# Patient Record
Sex: Male | Born: 1995 | Race: Asian | Hispanic: No | Marital: Single | Smoking: Never smoker
Health system: Southern US, Community
[De-identification: ages and names within clinical notes are randomized; demographics above are authoritative.]

## PROBLEM LIST (undated history)

## (undated) DIAGNOSIS — M21612 Bunion of left foot: Secondary | ICD-10-CM

## (undated) DIAGNOSIS — M79671 Pain in right foot: Secondary | ICD-10-CM

## (undated) DIAGNOSIS — M7740 Metatarsalgia, unspecified foot: Secondary | ICD-10-CM

## (undated) DIAGNOSIS — M21611 Bunion of right foot: Secondary | ICD-10-CM

## (undated) DIAGNOSIS — F819 Developmental disorder of scholastic skills, unspecified: Secondary | ICD-10-CM

## (undated) DIAGNOSIS — R269 Unspecified abnormalities of gait and mobility: Secondary | ICD-10-CM

## (undated) DIAGNOSIS — M79672 Pain in left foot: Secondary | ICD-10-CM

## (undated) HISTORY — DX: Developmental disorder of scholastic skills, unspecified: F81.9

## (undated) HISTORY — DX: Unspecified abnormalities of gait and mobility: R26.9

## (undated) HISTORY — DX: Bunion of right foot: M21.612

## (undated) HISTORY — DX: Bunion of right foot: M21.611

## (undated) HISTORY — DX: Pain in left foot: M79.672

## (undated) HISTORY — DX: Metatarsalgia, unspecified foot: M77.40

## (undated) HISTORY — DX: Pain in right foot: M79.671

---
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
## 2017-10-25 IMAGING — MR MR KNEE*R* W/O CM
6 series · 35 of 40 positions shown · non-contrast
Comparison: None.

CLINICAL DATA: Right knee pain.  Injured 1 week ago snowboarding.

EXAM:
MRI OF THE RIGHT KNEE WITHOUT CONTRAST
TECHNIQUE: Multiplanar, multisequence MR imaging of the knee was performed. No
intravenous contrast was administered.

[Series 7: T1 · coronal · right · 3.0mm · 0.33mm/px · 3 of 31 slices shown]
[im 1/31]
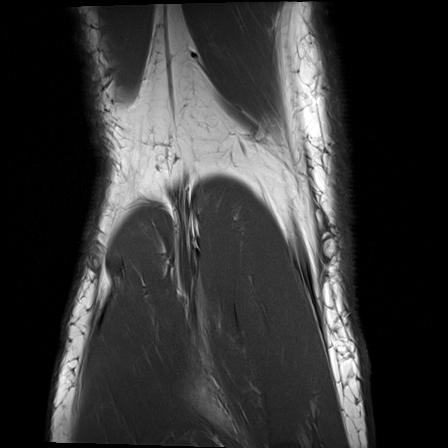
[im 5/31]
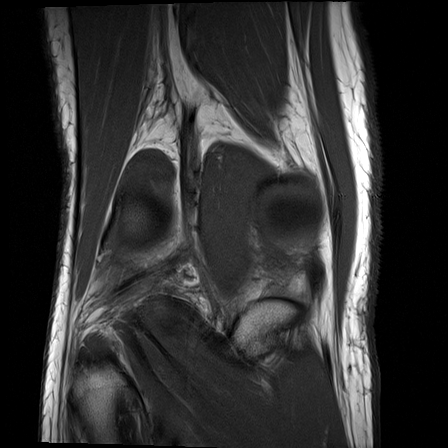
[im 9/31]
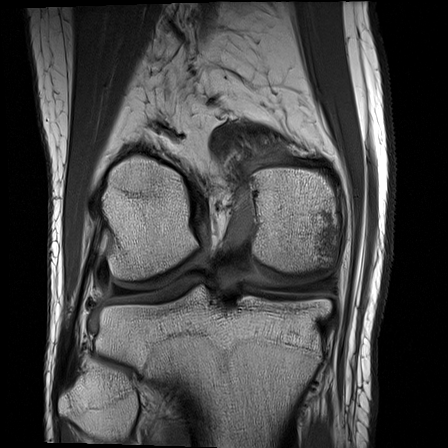

[Series 8: PD fat-sat · sagittal · right · 3.0mm · 0.47mm/px · 7 of 33 slices shown (1 of 3)]
[im 1/33]
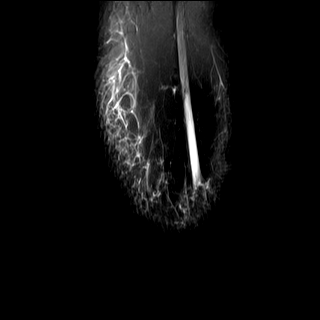
[im 6/33]
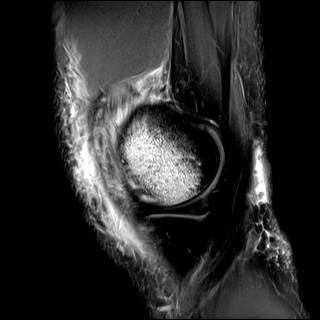
[im 11/33]
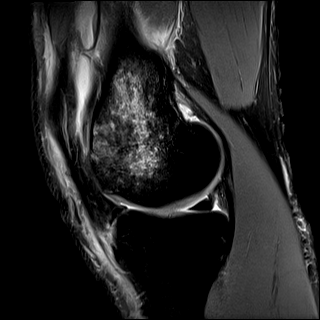
[im 17/33]
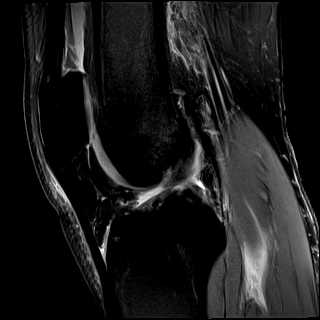
[im 22/33]
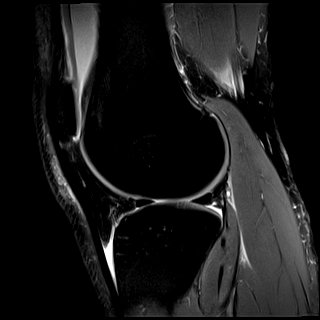
[im 27/33]
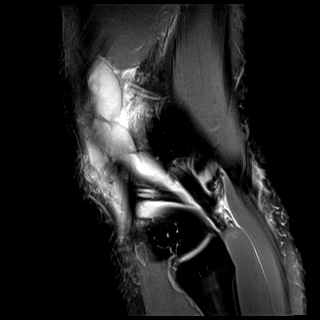
[im 33/33]
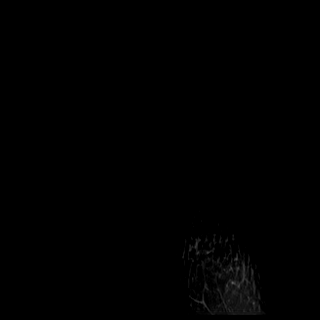

[Series 9: PD fat-sat · coronal · right · 3.0mm · 0.33mm/px · 7 of 31 slices shown (2 of 3)]
[im 1/31]
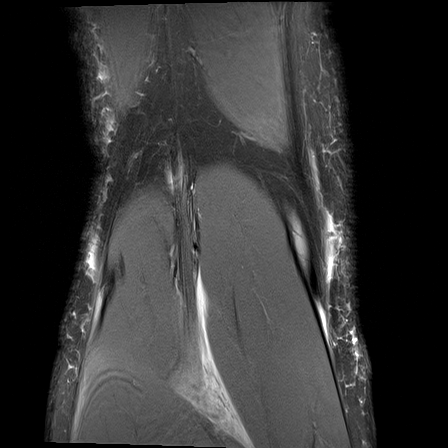
[im 6/31]
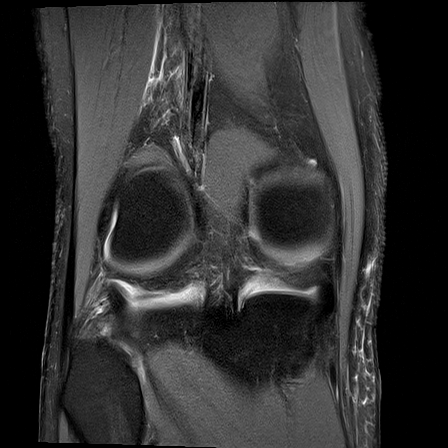
[im 11/31]
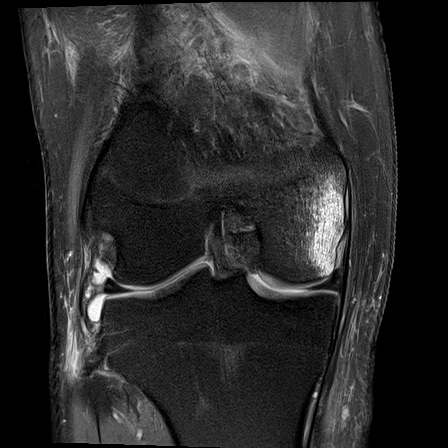
[im 16/31]
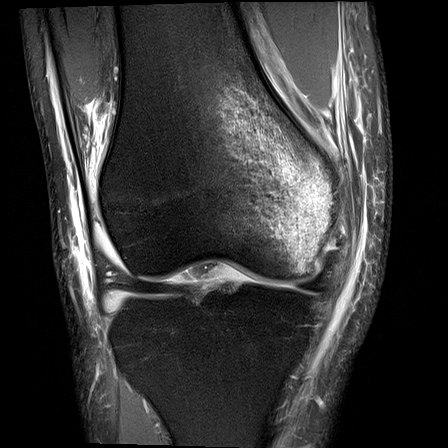
[im 21/31]
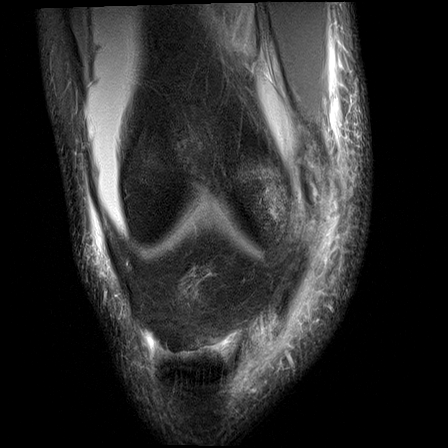
[im 26/31]
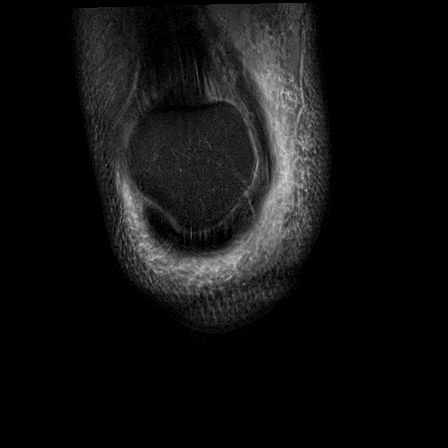
[im 31/31]
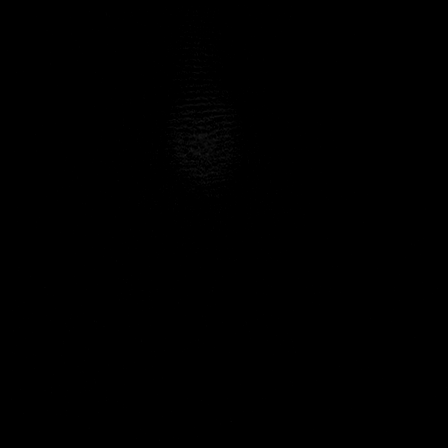

[Series 10: T2 fat-sat · coronal · right · 3.0mm · 0.39mm/px · 7 of 31 slices shown]
[im 1/31]
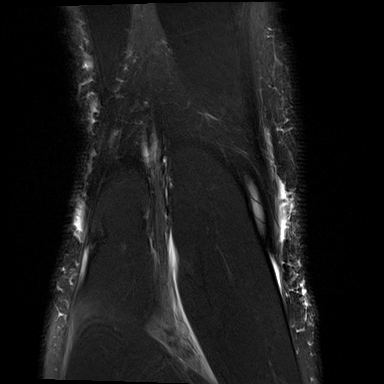
[im 6/31]
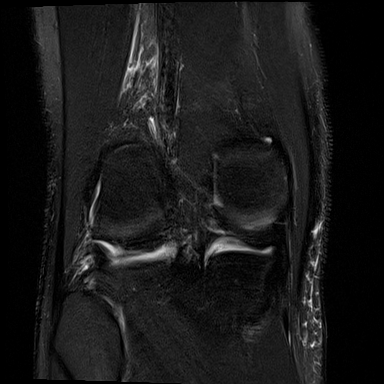
[im 11/31]
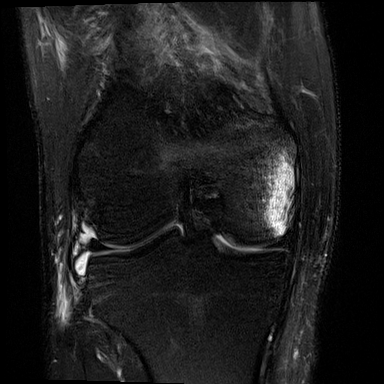
[im 16/31]
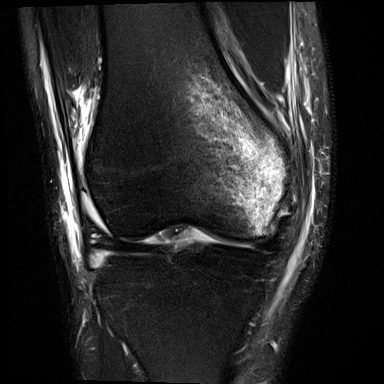
[im 21/31]
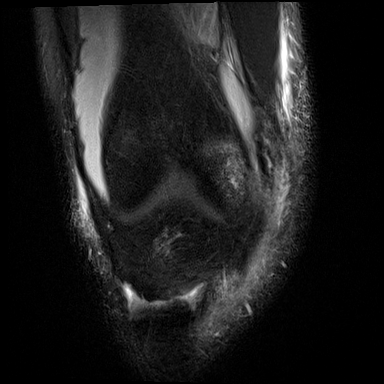
[im 26/31]
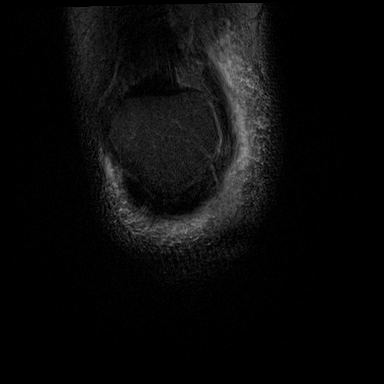
[im 31/31]
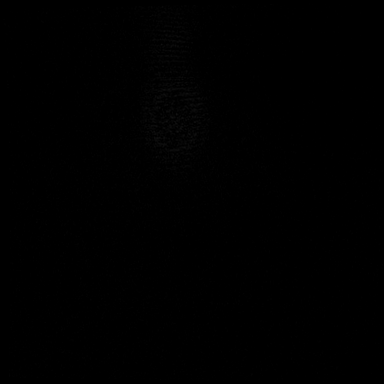

[Series 11: PD fat-sat · axial · right · 3.0mm · 0.50mm/px · z∈[-29,+92]mm · 8 of 38 slices shown (3 of 3)]
[im 1/38]
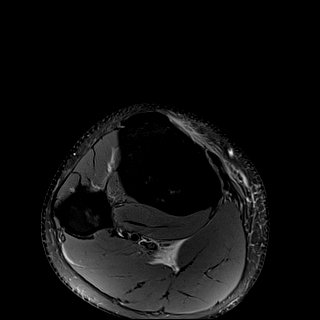
[im 6/38]
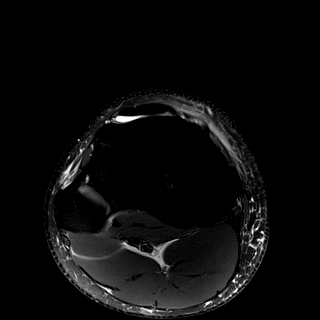
[im 11/38]
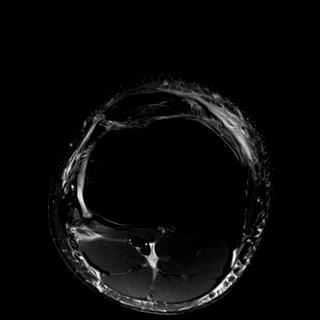
[im 16/38]
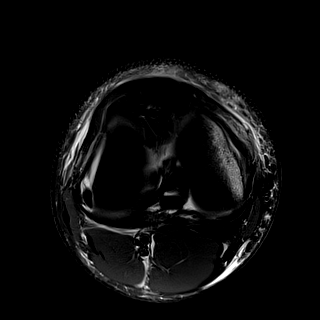
[im 22/38]
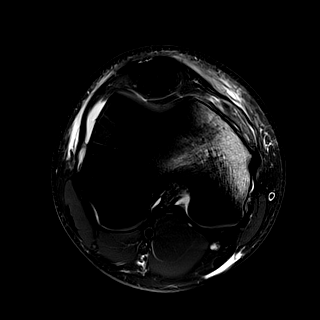
[im 27/38]
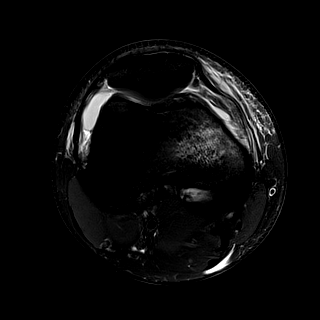
[im 32/38]
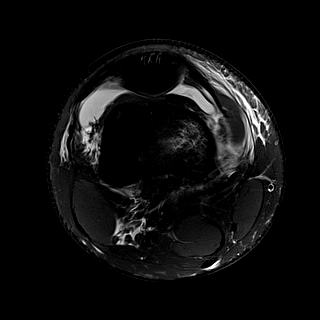
[im 38/38]
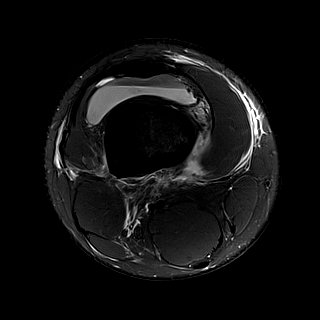

[Series 12: PD · oblique · right · 1.5mm · 0.44mm/px · 3 of 15 slices shown]
[im 1/15]
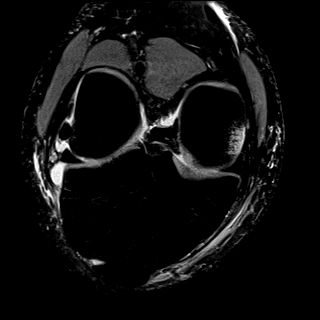
[im 8/15]
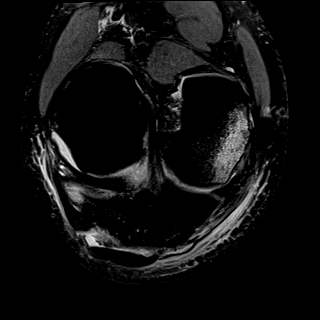
[im 15/15]
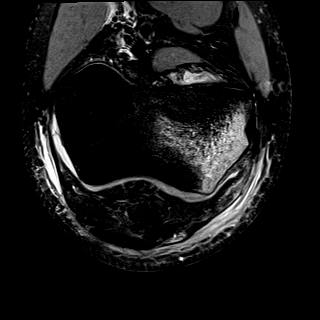

[35 of 40 positions shown; findings below may reference images not displayed]

FINDINGS: MENISCI

Medial meniscus:  Intact.

Lateral meniscus:  Intact.

LIGAMENTS

Cruciates:  Intact ACL and PCL.

Collaterals: Medial collateral ligament is intact. Lateral
collateral ligament complex is intact.

CARTILAGE

Patellofemoral:  No chondral defect.

Medial:  No chondral defect.

Lateral:  No chondral defect.

Joint: Large hemarthrosis. Mild edema in Hoffa's fat. No plical
thickening.

Popliteal Fossa:  Intact popliteus tendon.  No Baker cyst.

Extensor Mechanism:  Intact.

Bones: Severe marrow edema in the peripheral aspect of the medial
femoral condyle. Cortical irregularity at the peripheral aspect of
the lateral trochlea (image 12/series 8).

Soft tissue: Mild soft tissue edema superficial to the iliotibial
band. Small amount of fluid deep to the gastrocnemius muscles.
IMPRESSION: 1. No meniscal or ligamentous injury of the right knee.
2. Large hemarthrosis.
3. Severe marrow edema in the peripheral aspect of the medial
femoral condyle likely reflecting severe marrow contusion secondary
to direct trauma with a possible small nondisplaced fracture at the
peripheral aspect of the lateral trochlea (image 12/series 8).

## 2022-06-30 ENCOUNTER — Ambulatory Visit (INDEPENDENT_AMBULATORY_CARE_PROVIDER_SITE_OTHER): Payer: BC Managed Care – PPO | Admitting: Sports Medicine

## 2022-06-30 VITALS — BP 106/68 | Ht 71.0 in | Wt 158.0 lb

## 2022-06-30 DIAGNOSIS — M79672 Pain in left foot: Secondary | ICD-10-CM

## 2022-06-30 DIAGNOSIS — M79671 Pain in right foot: Secondary | ICD-10-CM

## 2022-06-30 NOTE — Assessment & Plan Note (Signed)
WE have successfully treated his foot pain, early bunion change and pes planus with custom orthotics.  New pair made today.

## 2022-06-30 NOTE — Progress Notes (Signed)
Zachary Wiley is here today for new orthotics.  We made him some orthotics about 6 years ago.  He has been wearing them daily.  He has worn holes in these orthotics and is here for a new pair. On exam he has bilateral pes planus, hallux valgus right greater than left, collapse of the transverse arch, some slight calcaneal valgus and first metatarsal insufficiency.  Patient was fitted for a standard, cushioned, semi-rigid orthotic. The orthotic was heated and afterward the patient stood on the orthotic base positioned on the orthotic stand. The patient was positioned in subtalar neutral position and 10 degrees of ankle dorsiflexion in a weight bearing stance. After completion of molding, a stable base was applied to the orthotic blank. The base was ground to a stable position for weight bearing. Size: 12 Base: [facility], hospital and Padding: 1st ray posting, metatarsal pads The patient ambulated these, and they were very comfortable.   I observed and examined the patient with the [facility] resident and agree with assessment and plan.  Note reviewed and modified by me. Sterling Big, MD
